# Patient Record
Sex: Male | Born: 2005 | Race: White | Hispanic: No | Marital: Single | Smoking: Never smoker
Health system: Southern US, Community
[De-identification: ages and names within clinical notes are randomized; demographics above are authoritative.]

## PROBLEM LIST (undated history)

## (undated) ENCOUNTER — Emergency Department (HOSPITAL_BASED_OUTPATIENT_CLINIC_OR_DEPARTMENT_OTHER): Payer: Self-pay | Source: Home / Self Care

---
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
## 2023-10-15 ENCOUNTER — Emergency Department (HOSPITAL_BASED_OUTPATIENT_CLINIC_OR_DEPARTMENT_OTHER)
Admission: EM | Admit: 2023-10-15 | Discharge: 2023-10-16 | Disposition: A | Discharge status: Home / Self Care | Attending: Emergency Medicine | Admitting: Emergency Medicine

## 2023-10-15 ENCOUNTER — Encounter (HOSPITAL_BASED_OUTPATIENT_CLINIC_OR_DEPARTMENT_OTHER): Payer: Self-pay

## 2023-10-15 ENCOUNTER — Other Ambulatory Visit: Payer: Self-pay

## 2023-10-15 DIAGNOSIS — R519 Headache, unspecified: Secondary | ICD-10-CM | POA: Insufficient documentation

## 2023-10-15 DIAGNOSIS — R109 Unspecified abdominal pain: Secondary | ICD-10-CM | POA: Diagnosis not present

## 2023-10-15 DIAGNOSIS — R112 Nausea with vomiting, unspecified: Secondary | ICD-10-CM | POA: Insufficient documentation

## 2023-10-15 DIAGNOSIS — J029 Acute pharyngitis, unspecified: Secondary | ICD-10-CM | POA: Diagnosis not present

## 2023-10-15 DIAGNOSIS — R197 Diarrhea, unspecified: Secondary | ICD-10-CM | POA: Insufficient documentation

## 2023-10-15 LAB — URINALYSIS, ROUTINE W REFLEX MICROSCOPIC
Bilirubin Urine: NEGATIVE
Glucose, UA: NEGATIVE mg/dL
Hgb urine dipstick: NEGATIVE
Ketones, ur: 15 mg/dL — AB
Leukocytes,Ua: NEGATIVE
Nitrite: NEGATIVE
Protein, ur: 30 mg/dL — AB
Specific Gravity, Urine: 1.036 — ABNORMAL HIGH (ref 1.005–1.030)
pH: 7 (ref 5.0–8.0)

## 2023-10-15 LAB — RESP PANEL BY RT-PCR (RSV, FLU A&B, COVID)  RVPGX2
Influenza A by PCR: NEGATIVE
Influenza B by PCR: NEGATIVE
Resp Syncytial Virus by PCR: NEGATIVE
SARS Coronavirus 2 by RT PCR: NEGATIVE

## 2023-10-15 LAB — COMPREHENSIVE METABOLIC PANEL WITH GFR
ALT: 22 U/L (ref 0–44)
AST: 23 U/L (ref 15–41)
Albumin: 4 g/dL (ref 3.5–5.0)
Alkaline Phosphatase: 63 U/L (ref 38–126)
Anion gap: 11 (ref 5–15)
BUN: 10 mg/dL (ref 6–20)
CO2: 25 mmol/L (ref 22–32)
Calcium: 9.1 mg/dL (ref 8.9–10.3)
Chloride: 99 mmol/L (ref 98–111)
Creatinine, Ser: 0.87 mg/dL (ref 0.61–1.24)
GFR, Estimated: >60 mL/min (ref >60)
Glucose, Bld: 126 mg/dL — ABNORMAL HIGH (ref 70–99)
Potassium: 3.6 mmol/L (ref 3.5–5.1)
Sodium: 135 mmol/L (ref 135–145)
Total Bilirubin: 0.6 mg/dL (ref 0.0–1.2)
Total Protein: 7.6 g/dL (ref 6.5–8.1)

## 2023-10-15 LAB — CBC
HCT: 40.6 % (ref 39.0–52.0)
Hemoglobin: 13.8 g/dL (ref 13.0–17.0)
MCH: 29.9 pg (ref 26.0–34.0)
MCHC: 34 g/dL (ref 30.0–36.0)
MCV: 88.1 fL (ref 80.0–100.0)
Platelets: 190 10*3/uL (ref 150–400)
RBC: 4.61 MIL/uL (ref 4.22–5.81)
RDW: 11.9 % (ref 11.5–15.5)
WBC: 10.4 10*3/uL (ref 4.0–10.5)
nRBC: 0 % (ref 0.0–0.2)

## 2023-10-15 LAB — LIPASE, BLOOD: Lipase: 15 U/L (ref 11–51)

## 2023-10-15 NOTE — ED Triage Notes (Signed)
 Pt reports emesis and headache over the last three days. Pt reports he has been unable to keep anything down. Pt denies abdominal pain.

## 2023-10-16 ENCOUNTER — Emergency Department (HOSPITAL_BASED_OUTPATIENT_CLINIC_OR_DEPARTMENT_OTHER)

## 2023-10-16 LAB — GROUP A STREP BY PCR: Group A Strep by PCR: NOT DETECTED

## 2023-10-16 LAB — MONONUCLEOSIS SCREEN: Mono Screen: NEGATIVE

## 2023-10-16 MED ORDER — IOHEXOL 350 MG/ML SOLN
100.0000 mL | Freq: Once | INTRAVENOUS | Status: AC | PRN
  Administered 2023-10-16: 100 mL via INTRAVENOUS

## 2023-10-16 MED ORDER — DOXYCYCLINE HYCLATE 100 MG PO CAPS: 100.0000 mg | ORAL_CAPSULE | Freq: Two times a day (BID) | ORAL | 0 refills | Status: AC

## 2023-10-16 MED ORDER — ONDANSETRON HCL 4 MG/2ML IJ SOLN
4.0000 mg | Freq: Once | INTRAMUSCULAR | Status: AC
  Administered 2023-10-16: 4 mg via INTRAVENOUS
  Filled 2023-10-16: qty 2

## 2023-10-16 MED ORDER — MAGNESIUM SULFATE 2 GM/50ML IV SOLN
2.0000 g | Freq: Once | INTRAVENOUS | Status: AC
  Administered 2023-10-16: 2 g via INTRAVENOUS
  Filled 2023-10-16: qty 50

## 2023-10-16 MED ORDER — DIPHENHYDRAMINE HCL 50 MG/ML IJ SOLN
12.5000 mg | Freq: Once | INTRAMUSCULAR | Status: AC
  Administered 2023-10-16: 12.5 mg via INTRAVENOUS
  Filled 2023-10-16: qty 1

## 2023-10-16 MED ORDER — ONDANSETRON 8 MG PO TBDP: ORAL_TABLET | ORAL | 0 refills | Status: AC

## 2023-10-16 MED ORDER — METOCLOPRAMIDE HCL 5 MG/ML IJ SOLN
10.0000 mg | Freq: Once | INTRAMUSCULAR | Status: AC
  Administered 2023-10-16: 10 mg via INTRAVENOUS
  Filled 2023-10-16: qty 2

## 2023-10-16 MED ORDER — SODIUM CHLORIDE 0.9 % IV BOLUS
1000.0000 mL | Freq: Once | INTRAVENOUS | Status: AC
  Administered 2023-10-16: 1000 mL via INTRAVENOUS

## 2023-10-16 MED ORDER — KETOROLAC TROMETHAMINE 30 MG/ML IJ SOLN
15.0000 mg | Freq: Once | INTRAMUSCULAR | Status: AC
  Administered 2023-10-16: 15 mg via INTRAVENOUS
  Filled 2023-10-16: qty 1

## 2023-10-16 NOTE — Discharge Instructions (Signed)
 Return to the ED for worsening headache, neck stiffness, rashes on the skin or any concerns.

## 2023-10-16 NOTE — ED Provider Notes (Signed)
  EMERGENCY DEPARTMENT AT [facility] Provider Note   CSN: [id number] Arrival date & time: 10/15/23  2239     Patient presents with: Emesis   Mark Spence is a 18 y.o. male.   The history is provided by the patient.  Emesis Severity:  Moderate Duration:  4 days Timing:  Intermittent Quality:  Stomach contents Progression:  Unchanged Chronicity:  New Recent urination:  Normal Context: not post-tussive   Relieved by:  Nothing Worsened by:  Nothing Ineffective treatments:  None tried Associated symptoms: abdominal pain, diarrhea and headaches   Risk factors: no prior abdominal surgery   Vomiting x 4 days with diarrhea.  Then developed frontal headache.  No known tick exposures.  No rashes.  Was at the beach last week.  Up to date on vaccinations going to college this fall.       Prior to Admission medications   Not on File    Allergies: Patient has no known allergies.    Review of Systems  Constitutional:  Negative for fatigue.  Eyes:  Negative for redness.  Gastrointestinal:  Positive for abdominal pain, diarrhea and vomiting.  Skin:  Negative for rash.  Neurological:  Positive for headaches.    Updated Vital Signs BP 133/72 (BP Location: Right Arm)   Pulse 75   Temp 99.5 F (37.5 C) (Oral)   Resp 17   Ht 5' 9 (1.753 m)   SpO2 99%   Physical Exam Vitals and nursing note reviewed.  Constitutional:      General: He is not in acute distress.    Appearance: Normal appearance. He is well-developed. He is not ill-appearing, toxic-appearing or diaphoretic.     Comments: Well appearing   HENT:     Head: Normocephalic and atraumatic.     Nose: Nose normal.     Mouth/Throat:     Mouth: Mucous membranes are moist.  Eyes:     Extraocular Movements: Extraocular movements intact.     Conjunctiva/sclera: Conjunctivae normal.     Pupils: Pupils are equal, round, and reactive to light.  Neck:     Meningeal: Brudzinski's sign and Kernig's sign  absent.     Comments: No meningismus  Cardiovascular:     Rate and Rhythm: Normal rate and regular rhythm.     Pulses: Normal pulses.     Heart sounds: Normal heart sounds.  Pulmonary:     Effort: Pulmonary effort is normal.     Breath sounds: Normal breath sounds. No wheezing or rales.  Abdominal:     General: Bowel sounds are normal.     Palpations: Abdomen is soft.     Tenderness: There is no abdominal tenderness. There is no guarding or rebound.  Musculoskeletal:        General: Normal range of motion.     Cervical back: Normal range of motion and neck supple. No rigidity, tenderness or crepitus.  Lymphadenopathy:     Cervical: No cervical adenopathy.  Skin:    General: Skin is warm and dry.     Capillary Refill: Capillary refill takes less than 2 seconds.  Neurological:     General: No focal deficit present.     Mental Status: He is alert and oriented to person, place, and time.     Deep Tendon Reflexes: Reflexes normal.     Comments: Ambulated to the bathroom without difficulty   Psychiatric:        Mood and Affect: Mood normal.     (all labs  ordered are listed, but only abnormal results are displayed) Results for orders placed or performed during the hospital encounter of 10/15/23  Comprehensive metabolic panel   Collection Time: 10/15/23 10:48 PM  Result Value Ref Range   Sodium 135 135 - 145 mmol/L   Potassium 3.6 3.5 - 5.1 mmol/L   Chloride 99 98 - 111 mmol/L   CO2 25 22 - 32 mmol/L   Glucose, Bld 126 (H) 70 - 99 mg/dL   BUN 10 6 - 20 mg/dL   Creatinine, Ser 9.12 0.61 - 1.24 mg/dL   Calcium 9.1 8.9 - 89.6 mg/dL   Total Protein 7.6 6.5 - 8.1 g/dL   Albumin 4.0 3.5 - 5.0 g/dL   AST 23 15 - 41 U/L   ALT 22 0 - 44 U/L   Alkaline Phosphatase 63 38 - 126 U/L   Total Bilirubin 0.6 0.0 - 1.2 mg/dL   GFR, Estimated >39 >39 mL/min   Anion gap 11 5 - 15  CBC   Collection Time: 10/15/23 10:48 PM  Result Value Ref Range   WBC 10.4 4.0 - 10.5 K/uL   RBC 4.61 4.22 -  5.81 MIL/uL   Hemoglobin 13.8 13.0 - 17.0 g/dL   HCT 59.3 60.9 - 47.9 %   MCV 88.1 80.0 - 100.0 fL   MCH 29.9 26.0 - 34.0 pg   MCHC 34.0 30.0 - 36.0 g/dL   RDW 88.0 88.4 - 84.4 %   Platelets 190 150 - 400 K/uL   nRBC 0.0 0.0 - 0.2 %  Lipase, blood   Collection Time: 10/15/23 10:48 PM  Result Value Ref Range   Lipase 15 11 - 51 U/L  Urinalysis, Routine w reflex microscopic -Urine, Clean Catch   Collection Time: 10/15/23 10:53 PM  Result Value Ref Range   Color, Urine YELLOW YELLOW   APPearance HAZY (A) CLEAR   Specific Gravity, Urine 1.036 (H) 1.005 - 1.030   pH 7.0 5.0 - 8.0   Glucose, UA NEGATIVE NEGATIVE mg/dL   Hgb urine dipstick NEGATIVE NEGATIVE   Bilirubin Urine NEGATIVE NEGATIVE   Ketones, ur 15 (A) NEGATIVE mg/dL   Protein, ur 30 (A) NEGATIVE mg/dL   Nitrite NEGATIVE NEGATIVE   Leukocytes,Ua NEGATIVE NEGATIVE   RBC / HPF 0-5 0 - 5 RBC/hpf   WBC, UA 0-5 0 - 5 WBC/hpf   Bacteria, UA RARE (A) NONE SEEN   Squamous Epithelial / HPF 0-5 0 - 5 /HPF   Mucus PRESENT    Amorphous Crystal PRESENT   Resp panel by RT-PCR (RSV, Flu A&B, Covid) Anterior Nasal Swab   Collection Time: 10/15/23 11:17 PM   Specimen: Anterior Nasal Swab  Result Value Ref Range   SARS Coronavirus 2 by RT PCR NEGATIVE NEGATIVE   Influenza A by PCR NEGATIVE NEGATIVE   Influenza B by PCR NEGATIVE NEGATIVE   Resp Syncytial Virus by PCR NEGATIVE NEGATIVE  Group A Strep by PCR   Collection Time: 10/16/23 12:27 AM   Specimen: Throat; Sterile Swab  Result Value Ref Range   Group A Strep by PCR NOT DETECTED NOT DETECTED  Mononucleosis screen   Collection Time: 10/16/23  3:30 AM  Result Value Ref Range   Mono Screen NEGATIVE NEGATIVE   CT VENOGRAM HEAD Result Date: 10/16/2023 CLINICAL DATA:  Dural venous sinus thrombosis (Ped 0-17y) EXAM: CT VENOGRAM HEAD TECHNIQUE: Venographic phase images of the brain were obtained following the administration of intravenous contrast. Multiplanar reformats and maximum  intensity projections were generated.  RADIATION DOSE REDUCTION: This exam was performed according to the departmental dose-optimization program which includes automated exposure control, adjustment of the mA and/or kV according to patient size and/or use of iterative reconstruction technique. CONTRAST:  100mL OMNIPAQUE IOHEXOL 350 MG/ML SOLN COMPARISON:  None Available. FINDINGS: No evidence of dural venous sinus thrombosis. Specifically, superior sagittal, sigmoid, transverse and straight sinuses are patent. The visualized deep cerebral veins are patent. Symmetric cavernous sinus opacification. IMPRESSION: No evidence of dural venous sinus thrombosis. Electronically Signed   By: Gilmore GORMAN Molt M.D.   On: 10/16/2023 02:17   CT Head Wo Contrast Result Date: 10/16/2023 CLINICAL DATA:  Vomiting and headache. EXAM: CT HEAD WITHOUT CONTRAST TECHNIQUE: Contiguous axial images were obtained from the base of the skull through the vertex without intravenous contrast. RADIATION DOSE REDUCTION: This exam was performed according to the departmental dose-optimization program which includes automated exposure control, adjustment of the mA and/or kV according to patient size and/or use of iterative reconstruction technique. COMPARISON:  None Available. FINDINGS: Brain: No evidence of acute infarction, hemorrhage, hydrocephalus, extra-axial collection or mass lesion/mass effect. Vascular: No hyperdense vessel or unexpected calcification. Skull: Normal. Negative for fracture or focal lesion. Sinuses/Orbits: No acute finding. Other: None. IMPRESSION: No acute intracranial pathology. Electronically Signed   By: Suzen Dials M.D.   On: 10/16/2023 02:13    No results found.   Procedures   Medications Ordered in the ED  magnesium sulfate IVPB 2 g 50 mL (has no administration in time range)  metoCLOPramide (REGLAN) injection 10 mg (has no administration in time range)  diphenhydrAMINE (BENADRYL) injection 12.5 mg (has  no administration in time range)  sodium chloride 0.9 % bolus 1,000 mL (1,000 mLs Intravenous New Bag/Given 10/16/23 0038)  ondansetron (ZOFRAN) injection 4 mg (4 mg Intravenous Given 10/16/23 0038)  ketorolac (TORADOL) 30 MG/ML injection 15 mg (15 mg Intravenous Given 10/16/23 0038)                                    Medical Decision Making Patient with nausea and vomiting and sore throat and then developed headache unable to keep food down   Amount and/or Complexity of Data Reviewed Independent Historian: friend    Details: See above  Labs: ordered.    Details: Negative mono, negative strep.  Normal white count 10.4, normal hemoglobin 13.8, normal sodium 135, mormal potassium 3.6, normal creatinine 0.87.  lyme test sent  Radiology: ordered and independent interpretation performed.    Details: Negative head CT by me  Discussion of management or test interpretation with external provider(s): 3:19 AM d/w Dr. Vanessa, no LP at this time as is immune competent and no signs on labs or exam of this.  Send Mono spot.  Follow up with neurology as an outpatient.     Risk Prescription drug management. Risk Details: Patient reports 10/10 pain but no non verbal cues of pain.  Given entire migraine cocktail and is now Sleeping soundly in the room post medication.  Easily arousable and states he feels markedly improved pain.  Resting comfortable.  Considered ICH, this was ruled out on CT,  considered cavernous sinus thrombosis.  Also excluded.  I do not believe this is bacterial meningitis as the patient would be in extremis and have abnormal exam and vitals at this point, moreover patient is up to date on vaccinations and he should have had this.  I have sent tick borne  illness panel and will cover for this pending results.  I have advised close follow up with PMD and neurology and returning to the ED for fever, rashes neck stiffness.    Patient and family verbalize understanding and agree to follow up       Final diagnoses:  None   The patient is nontoxic-appearing on exam and vital signs are within normal limits.  I have reviewed the triage vital signs and the nursing notes. Pertinent labs & imaging results that were available during my care of the patient were reviewed by me and considered in my medical decision making (see chart for details). After history, exam, and medical workup I feel the patient has been appropriately medically screened and is safe for discharge home. Pertinent diagnoses were discussed with the patient. Patient was given return precautions. ED Discharge Orders     None          Derrel Moore, MD 10/16/23 [id number]

## 2023-10-16 NOTE — ED Notes (Signed)
 Patient transported to CT

## 2023-10-20 LAB — LYME DISEASE SEROLOGY W/REFLEX: Lyme Total Antibody EIA: NEGATIVE
